# Patient Record
Sex: Female | Born: 1990 | Race: White | Hispanic: No | Marital: Single | Smoking: Never smoker
Health system: Southern US, Community
[De-identification: ages and names within clinical notes are randomized; demographics above are authoritative.]

## PROBLEM LIST (undated history)

## (undated) DIAGNOSIS — N159 Renal tubulo-interstitial disease, unspecified: Secondary | ICD-10-CM

---
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
## 2010-04-05 ENCOUNTER — Ambulatory Visit: Payer: Self-pay | Admitting: Family Medicine

## 2010-04-25 ENCOUNTER — Ambulatory Visit: Payer: Self-pay | Admitting: Family Medicine

## 2010-04-26 ENCOUNTER — Ambulatory Visit: Payer: Self-pay | Admitting: Family Medicine

## 2010-04-28 ENCOUNTER — Ambulatory Visit: Payer: Self-pay | Admitting: Family Medicine

## 2010-05-18 ENCOUNTER — Ambulatory Visit: Payer: Self-pay | Admitting: Family Medicine

## 2010-08-06 DIAGNOSIS — N159 Renal tubulo-interstitial disease, unspecified: Secondary | ICD-10-CM

## 2010-08-06 HISTORY — DX: Renal tubulo-interstitial disease, unspecified: N15.9

## 2010-09-14 ENCOUNTER — Ambulatory Visit: Payer: Self-pay | Admitting: Family Medicine

## 2011-11-14 IMAGING — CR RIGHT HIP - COMPLETE 2+ VIEW
1 series · 2 of 2 positions shown · non-contrast
Comparison: none

REASON FOR EXAM: HX SHALLEY JIM RT HIP PAIN
COMMENTS:

PROCEDURE:     DXR - DXR HIP RIGHT COMPLETE  - April 25, 2010  [DATE]
RESULT:     No fracture, dislocation or other acute bony abnormality is
identified. Hip joint space is well-maintained.

[Series 1: view not recorded · 0.17mm/px · 2 of 2 slices shown]
[im 1/2]
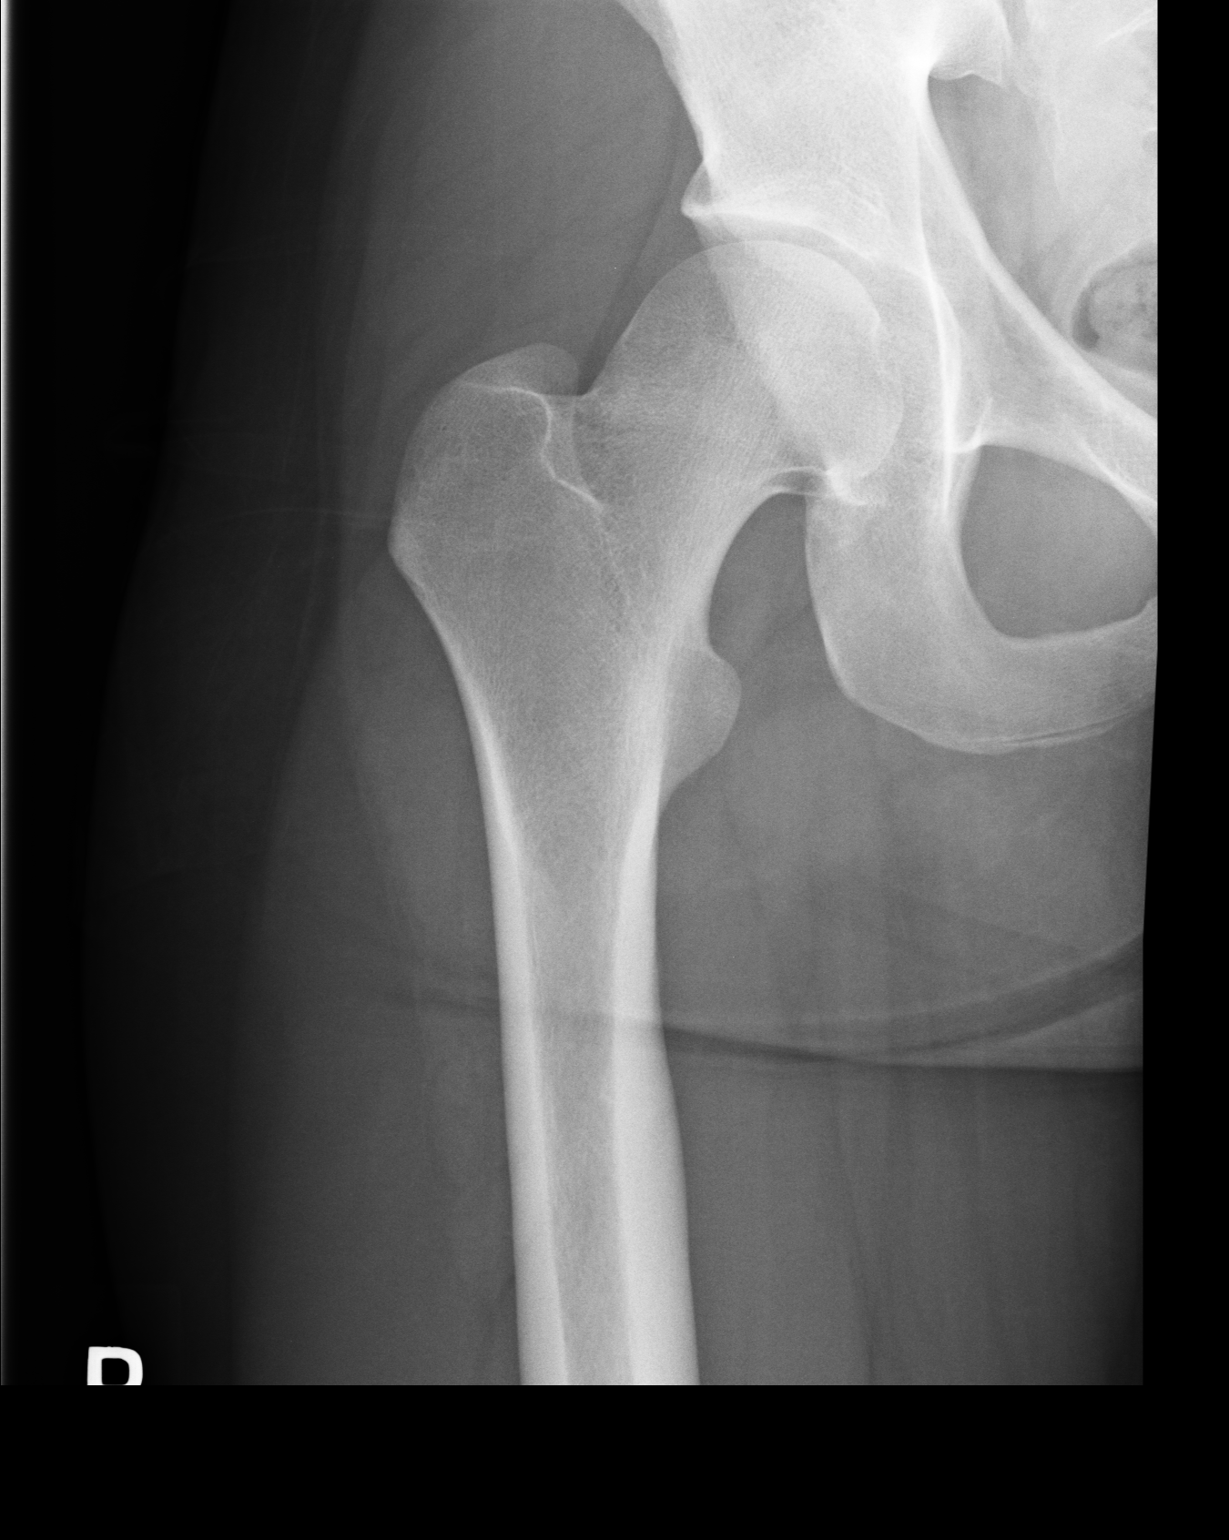
[im 2/2]
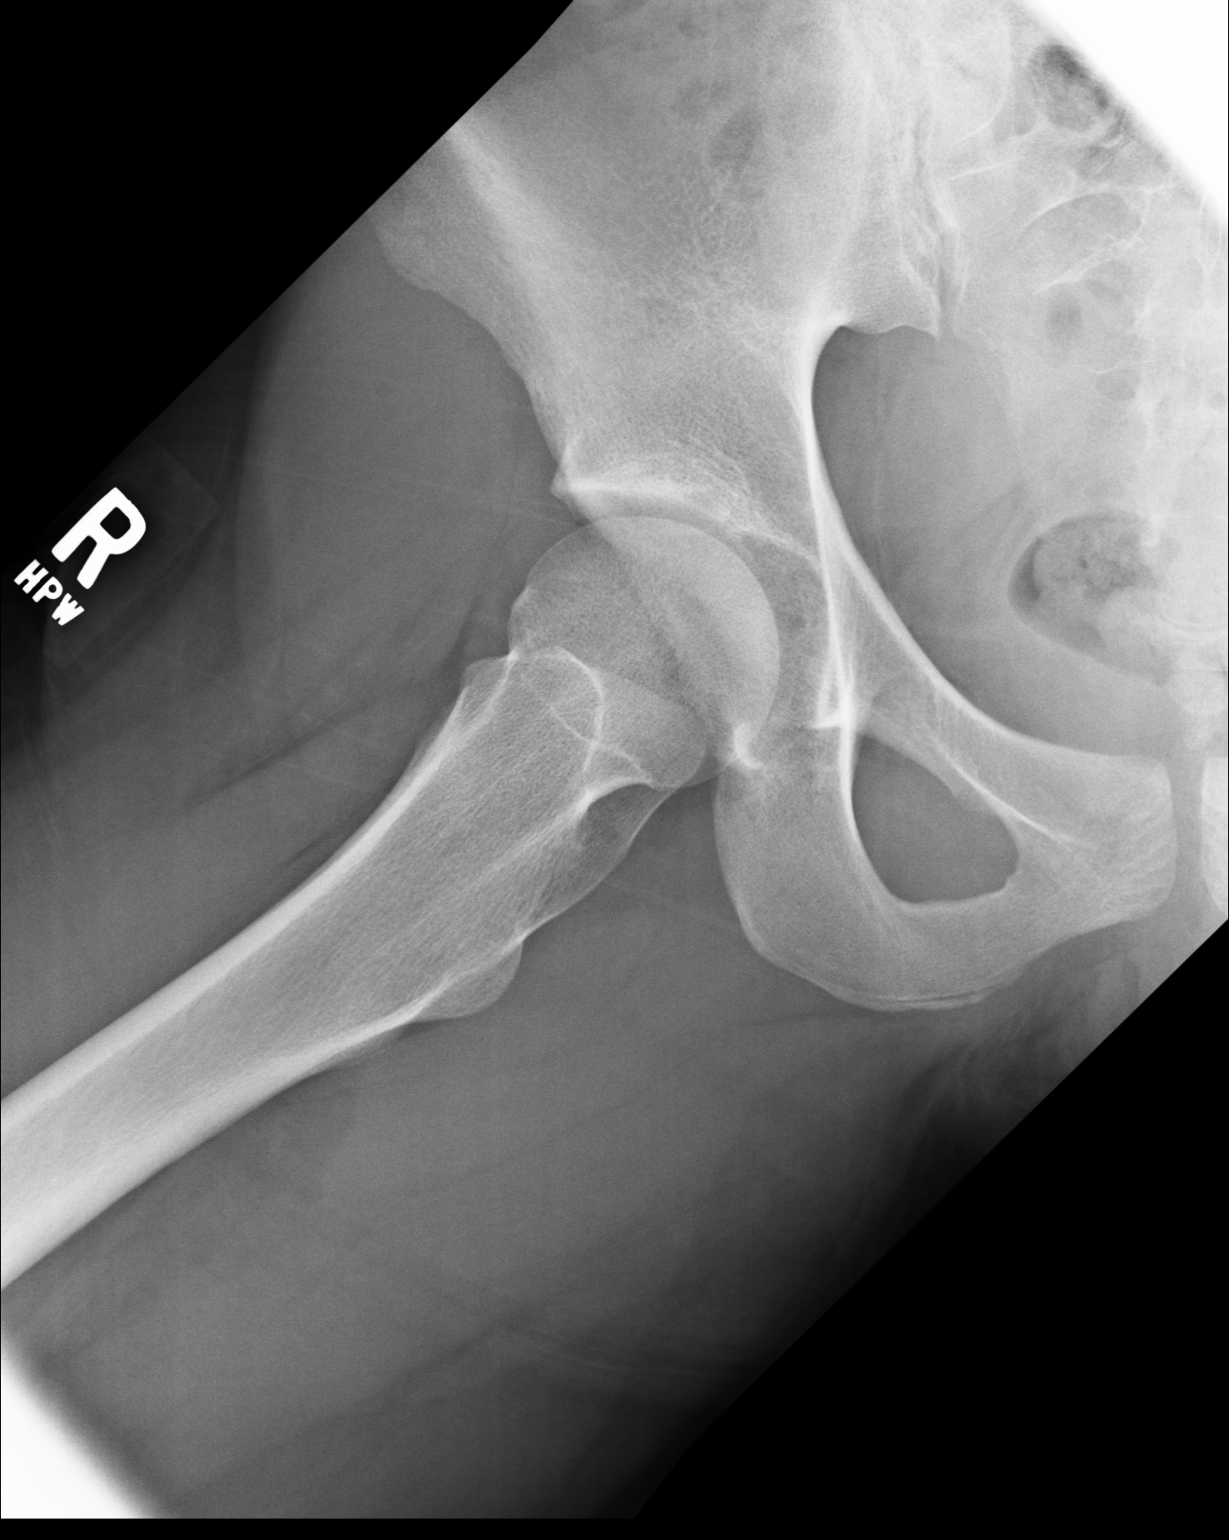

[2 of 2 positions shown; findings below may reference images not displayed]

IMPRESSION: No significant osseous abnormalities are noted.

## 2013-11-23 ENCOUNTER — Encounter (HOSPITAL_COMMUNITY): Payer: Self-pay | Admitting: Emergency Medicine

## 2013-11-23 ENCOUNTER — Emergency Department (INDEPENDENT_AMBULATORY_CARE_PROVIDER_SITE_OTHER)
Admission: EM | Admit: 2013-11-23 | Discharge: 2013-11-23 | Disposition: A | Discharge status: Home / Self Care | Payer: Managed Care, Other (non HMO) | Source: Home / Self Care

## 2013-11-23 DIAGNOSIS — S39012A Strain of muscle, fascia and tendon of lower back, initial encounter: Secondary | ICD-10-CM

## 2013-11-23 DIAGNOSIS — S335XXA Sprain of ligaments of lumbar spine, initial encounter: Secondary | ICD-10-CM

## 2013-11-23 HISTORY — DX: Renal tubulo-interstitial disease, unspecified: N15.9

## 2013-11-23 LAB — POCT URINALYSIS DIP (DEVICE)
Glucose, UA: NEGATIVE mg/dL
Hgb urine dipstick: NEGATIVE
Ketones, ur: NEGATIVE mg/dL
LEUKOCYTES UA: NEGATIVE
NITRITE: NEGATIVE
PH: 6 (ref 5.0–8.0)
PROTEIN: NEGATIVE mg/dL
Specific Gravity, Urine: 1.025 (ref 1.005–1.030)
Urobilinogen, UA: 0.2 mg/dL (ref 0.0–1.0)

## 2013-11-23 LAB — POCT PREGNANCY, URINE: Preg Test, Ur: NEGATIVE

## 2013-11-23 NOTE — Discharge Instructions (Signed)
Back Pain, Adult Low back pain is very common. About 1 in 5 people have back pain.The cause of low back pain is rarely dangerous. The pain often gets better over time.About half of people with a sudden onset of back pain feel better in just 2 weeks. About 8 in 10 people feel better by 6 weeks.  CAUSES Some common causes of back pain include:  Strain of the muscles or ligaments supporting the spine.  Wear and tear (degeneration) of the spinal discs.  Arthritis.  Direct injury to the back. DIAGNOSIS Most of the time, the direct cause of low back pain is not known.However, back pain can be treated effectively even when the exact cause of the pain is unknown.Answering your caregiver's questions about your overall health and symptoms is one of the most accurate ways to make sure the cause of your pain is not dangerous. If your caregiver needs more information, he or she may order lab work or imaging tests (X-rays or MRIs).However, even if imaging tests show changes in your back, this usually does not require surgery. HOME CARE INSTRUCTIONS For many people, back pain returns.Since low back pain is rarely dangerous, it is often a condition that people can learn to [facility] their own.   Remain active. It is stressful on the back to sit or stand in one place. Do not sit, drive, or stand in one place for more than 30 minutes at a time. Take short walks on level surfaces as soon as pain allows.Try to increase the length of time you walk each day.  Do not stay in bed.Resting more than 1 or 2 days can delay your recovery.  Do not avoid exercise or work.Your body is made to move.It is not dangerous to be active, even though your back may hurt.Your back will likely heal faster if you return to being active before your pain is gone.  Pay attention to your body when you bend and lift. Many people have less discomfortwhen lifting if they bend their knees, keep the load close to their bodies,and  avoid twisting. Often, the most comfortable positions are those that put less stress on your recovering back.  Find a comfortable position to sleep. Use a firm mattress and lie on your side with your knees slightly bent. If you lie on your back, put a pillow under your knees.  Only take over-the-counter or prescription medicines as directed by your caregiver. Over-the-counter medicines to reduce pain and inflammation are often the most helpful.Your caregiver may prescribe muscle relaxant drugs.These medicines help dull your pain so you can more quickly return to your normal activities and healthy exercise.  Put ice on the injured area.  Put ice in a plastic bag.  Place a towel between your skin and the bag.  Leave the ice on for 15-20 minutes, 03-04 times a day for the first 2 to 3 days. After that, ice and heat may be alternated to reduce pain and spasms.  Ask your caregiver about trying back exercises and gentle massage. This may be of some benefit.  Avoid feeling anxious or stressed.Stress increases muscle tension and can worsen back pain.It is important to recognize when you are anxious or stressed and learn ways to manage it.Exercise is a great option. SEEK MEDICAL CARE IF:  You have pain that is not relieved with rest or medicine.  You have pain that does not improve in 1 week.  You have new symptoms.  You are generally not feeling well. SEEK  IMMEDIATE MEDICAL CARE IF:   You have pain that radiates from your back into your legs.  You develop new bowel or bladder control problems.  You have unusual weakness or numbness in your arms or legs.  You develop nausea or vomiting.  You develop abdominal pain.  You feel faint. Document Released: 07/23/2005 Document Revised: 01/22/2012 Document Reviewed: 12/11/2010 [facility] Patient Information 2014 [place], [place].  Lumbosacral Strain Lumbosacral strain is a strain of any of the parts that make up your lumbosacral  vertebrae. Your lumbosacral vertebrae are the bones that make up the lower third of your backbone. Your lumbosacral vertebrae are held together by muscles and tough, fibrous tissue (ligaments).  CAUSES  A sudden blow to your back can cause lumbosacral strain. Also, anything that causes an excessive stretch of the muscles in the low back can cause this strain. This is typically seen when people exert themselves strenuously, fall, lift heavy objects, bend, or crouch repeatedly. RISK FACTORS  Physically demanding work.  Participation in pushing or pulling sports or sports that require sudden twist of the back (tennis, golf, baseball).  Weight lifting.  Excessive lower back curvature.  Forward-tilted pelvis.  Weak back or abdominal muscles or both.  Tight hamstrings. SIGNS AND SYMPTOMS  Lumbosacral strain may cause pain in the area of your injury or pain that moves (radiates) down your leg.  DIAGNOSIS Your health care provider can often diagnose lumbosacral strain through a physical exam. In some cases, you may need tests such as X-ray exams.  TREATMENT  Treatment for your lower back injury depends on many factors that your clinician will have to evaluate. However, most treatment will include the use of anti-inflammatory medicines. HOME CARE INSTRUCTIONS   Avoid hard physical activities (tennis, racquetball, waterskiing) if you are not in proper physical condition for it. This may aggravate or create problems.  If you have a back problem, avoid sports requiring sudden body movements. Swimming and walking are generally safer activities.  Maintain good posture.  Maintain a healthy weight.  Heat  When the low back starts healing, stretching and strengthening exercises may be recommended. SEEK MEDICAL CARE IF:  Your back pain is getting worse.  You experience severe back pain not relieved with medicines. SEEK IMMEDIATE MEDICAL CARE IF:   You have numbness, tingling, weakness, or  problems with the use of your arms or legs.  There is a change in bowel or bladder control.  You have increasing pain in any area of the body, including your belly (abdomen).  You notice shortness of breath, dizziness, or feel faint.  You feel sick to your stomach (nauseous), are throwing up (vomiting), or become sweaty.  You notice discoloration of your toes or legs, or your feet get very cold. MAKE SURE YOU:   Understand these instructions.  Will watch your condition.  Will get help right away if you are not doing well or get worse. Document Released: 05/02/2005 Document Revised: 05/13/2013 Document Reviewed: 03/11/2013 [facility] Patient Information 2014 [place], [place].

## 2013-11-23 NOTE — ED Notes (Signed)
C/o bil. Flank pain onset Friday and getting worse.  Took Advil 400 mg x 2 today.  No burning or frequency of urination.  She has been urinating more at night.   No hematuria.  Is concerned for possible kidney infection or muscle strain.

## 2013-11-23 NOTE — ED Provider Notes (Signed)
CSN: [id number]     Arrival date & time 11/23/13  1710 History   First MD Initiated Contact with Patient 11/23/13 1821     Chief Complaint  Patient presents with  . Back Pain   (Consider location/radiation/quality/duration/timing/severity/associated sxs/prior Treatment) HPI Comments: 23 y o f with low back pain for about 1 week. Located para lumbar musculature. Nonradiating. Worse with bending, lifting, getting out of bed in the AM. Better with hot shower.  No known injury, fall, trauma.  No dysuria, some nightime frequency.    Past Medical History  Diagnosis Date  . Kidney infection 2012   Past Surgical History  Procedure Laterality Date  . Wisdom tooth extraction  2009   History reviewed. No pertinent family history. History  Substance Use Topics  . Smoking status: Never Smoker   . Smokeless tobacco: Not on file  . Alcohol Use: Yes     Comment: occasional   OB History   Grav Para Term Preterm Abortions TAB SAB Ect Mult Living                 Review of Systems  Constitutional: Negative for fever, chills and activity change.  HENT: Negative.   Respiratory: Negative.   Cardiovascular: Negative.   Genitourinary: Negative for dysuria, frequency, flank pain, vaginal discharge and pelvic pain.  Musculoskeletal:       As per HPI  Skin: Negative for color change, pallor and rash.  Neurological: Negative.     Allergies  Review of patient's allergies indicates no known allergies.  Home Medications   Prior to Admission medications   Medication Sig Start Date End Date Taking? Authorizing Provider  ibuprofen (ADVIL,MOTRIN) 200 MG tablet Take 400 mg by mouth every 6 (six) hours as needed for moderate pain.   Yes Historical Provider, MD  Norethindrone-Ethinyl Estradiol-Fe (GENERESS FE) 0.8-25 MG-MCG tablet Chew 1 tablet by mouth daily.   Yes Historical Provider, MD   BP 122/79  Pulse 49  Temp(Src) 98 F (36.7 C) (Oral)  SpO2 99%  LMP 11/13/2013 Physical Exam  Nursing  note and vitals reviewed. Constitutional: She is oriented to person, place, and time. She appears well-developed and well-nourished. No distress.  Neck: Normal range of motion. Neck supple.  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   Pulmonary/Chest: Effort normal and breath sounds normal. No respiratory distress.  Abdominal: Soft. She exhibits no distension and no mass. There is no tenderness. There is no rebound and no guarding.  Musculoskeletal:  Tenderness paralumbar musculature. No spinal tenderness.   Neurological: She is alert and oriented to person, place, and time.  Skin: Skin is warm and dry.  Psychiatric: She has a normal mood and affect.    ED Course  Procedures (including critical care time) Labs Review Labs Reviewed  POCT PREGNANCY, URINE    Results for orders placed during the hospital encounter of 11/23/13  POCT PREGNANCY, URINE      Result Value Ref Range   Preg Test, Ur NEGATIVE  NEGATIVE   Imaging Review No results found.  Results for orders placed during the hospital encounter of 11/23/13  POCT PREGNANCY, URINE      Result Value Ref Range   Preg Test, Ur NEGATIVE  NEGATIVE    MDM   1. Strain of lumbar paraspinal muscle    Stretches, heat, NSAID of choice. No heavy lifting, pulling, or bad posture    Hayden Rasmussenavid Beverley Sherrard, NP 11/23/13 1845

## 2013-11-27 NOTE — ED Provider Notes (Signed)
Medical screening examination/treatment/procedure(s) were performed by resident physician or non-physician practitioner and as supervising physician I was immediately available for consultation/collaboration.   KINDL,JAMES DOUGLAS MD.   James D Kindl, MD 11/27/13 1030 

## 2015-03-23 ENCOUNTER — Encounter (HOSPITAL_COMMUNITY): Payer: Self-pay | Admitting: Emergency Medicine

## 2015-03-23 ENCOUNTER — Emergency Department (HOSPITAL_COMMUNITY)
Admission: EM | Admit: 2015-03-23 | Discharge: 2015-03-23 | Disposition: A | Discharge status: Home / Self Care | Payer: Managed Care, Other (non HMO) | Source: Home / Self Care | Attending: Family Medicine | Admitting: Family Medicine

## 2015-03-23 DIAGNOSIS — J020 Streptococcal pharyngitis: Secondary | ICD-10-CM | POA: Diagnosis not present

## 2015-03-23 LAB — POCT RAPID STREP A: Streptococcus, Group A Screen (Direct): NEGATIVE

## 2015-03-23 MED ORDER — AMOXICILLIN 500 MG PO CAPS: 500.0000 mg | ORAL_CAPSULE | Freq: Two times a day (BID) | ORAL | Status: AC

## 2015-03-23 MED ORDER — FLUCONAZOLE 150 MG PO TABS: 150.0000 mg | ORAL_TABLET | Freq: Every day | ORAL | Status: AC

## 2015-03-23 NOTE — Discharge Instructions (Signed)
You more than likely have a strep throat infection. Please use the antibiotic as prescribed. Please use your Advil 600-800 mg every 6-8 hours as needed for additional pain relief. Please also consider eating daily yogurt or a probiotic to help prevent diarrhea and upset stomach from the antibiotics. Please use the Diflucan if you develop a yeast infection.

## 2015-03-23 NOTE — ED Provider Notes (Signed)
CSN: [id number]     Arrival date & time 03/23/15  1650 History   First MD Initiated Contact with Patient 03/23/15 1709     Chief Complaint  Patient presents with  . URI   (Consider location/radiation/quality/duration/timing/severity/associated sxs/prior Treatment) HPI 1 day of sore throat. Denies any runny nose cough congestion, or other URI type symptoms. No sick contacts. Pain is constant and getting worse. Now with fevers greater than 101, diaphoresis, generalized body aches. Advil 400 mg with some improvement in pain.  Past Medical History  Diagnosis Date  . Kidney infection 2012   Past Surgical History  Procedure Laterality Date  . Wisdom tooth extraction  2009   History reviewed. No pertinent family history. Social History  Substance Use Topics  . Smoking status: Never Smoker   . Smokeless tobacco: None  . Alcohol Use: Yes     Comment: occasional   OB History    No data available     Review of Systems Per HPI with all other pertinent systems negative.   Allergies  Review of patient's allergies indicates no known allergies.  Home Medications   Prior to Admission medications   Medication Sig Start Date End Date Taking? Authorizing Provider  amoxicillin (AMOXIL) 500 MG capsule Take 1 capsule (500 mg total) by mouth 2 (two) times daily. 03/23/15   Ozella Rocks, MD  fluconazole (DIFLUCAN) 150 MG tablet Take 1 tablet (150 mg total) by mouth daily. Repeat dose in 3 days 03/23/15   Ozella Rocks, MD  ibuprofen (ADVIL,MOTRIN) 200 MG tablet Take 400 mg by mouth every 6 (six) hours as needed for moderate pain.    Historical Provider, MD  Norethindrone-Ethinyl Estradiol-Fe (GENERESS FE) 0.8-25 MG-MCG tablet Chew 1 tablet by mouth daily.    Historical Provider, MD   BP 118/73 mmHg  Pulse 77  Temp(Src) 100.1 F (37.8 C) (Oral)  Resp 16  SpO2 97%  LMP 03/09/2015 Physical Exam Physical Exam  Constitutional: oriented to person, place, and time. appears well-developed and  well-nourished. No distress.  HENT:  Head: Normocephalic and atraumatic.  Extremely injected pharynx, tonsils 0-1+ without exudate, "throaty" speech Eyes: EOMI. PERRL.  Neck: Normal range of motion.  Cardiovascular: RRR, no m/r/g, 2+ distal pulses,  Pulmonary/Chest: Effort normal and breath sounds normal. No respiratory distress.  Abdominal: Soft. Bowel sounds are normal. NonTTP, no distension.  Musculoskeletal: Normal range of motion. Non ttp, no effusion.  Neurological: alert and oriented to person, place, and time.  Skin: Skin is warm. No rash noted. non diaphoretic.  Psychiatric: normal mood and affect. behavior is normal. Judgment and thought content normal.    ED Course  Procedures (including critical care time) Labs Review Labs Reviewed  POCT RAPID STREP A    Imaging Review No results found.   MDM   1. Strep pharyngitis    Negative rapid strep, we'll send strep culture, amoxicillin, Diflucan if develops yeast infection as patient states that this happens with some regularity with antibiotic. Recommending probiotic and/or daily yogurt to prevent diarrhea and upset stomach on Avelox.    Ozella Rocks, MD 03/23/15 [phone]

## 2015-03-23 NOTE — ED Notes (Signed)
C/o cold sx States sore throat, fever, body ache and coughing  advil used as tx

## 2015-03-25 LAB — CULTURE, GROUP A STREP: Strep A Culture: NEGATIVE

## 2015-03-25 NOTE — ED Notes (Signed)
Patient called to report she has developed a generalized itchy red rash and has continued to take her amoxicillin. After review of her records which show a negative result on her final strep report, patient was advised to D/C her amoxicillin, to use OTC allergy medicine of choice, and to be rechecked if not better in a couple of days, or sooner if worse. Record has been amended to reflect allergy to amoxicillin

## 2023-03-14 ENCOUNTER — Ambulatory Visit (INDEPENDENT_AMBULATORY_CARE_PROVIDER_SITE_OTHER): Payer: Managed Care, Other (non HMO) | Admitting: Podiatry

## 2023-03-14 DIAGNOSIS — M778 Other enthesopathies, not elsewhere classified: Secondary | ICD-10-CM | POA: Diagnosis not present

## 2023-03-14 DIAGNOSIS — M84375A Stress fracture, left foot, initial encounter for fracture: Secondary | ICD-10-CM | POA: Diagnosis not present

## 2023-03-14 NOTE — Progress Notes (Signed)
Subjective:  Patient ID: Bridget Foley, female    DOB: February 19, 1991,  MRN: [id number]  Chief Complaint  Patient presents with   Foot Pain    Pain to left foot medial aspect of foot near the arch. Patient is a runner and the area has been hurting after she ran down and up some hills. She does not wear any inserts in her shoes.     32 y.o. female presents with concern for pain to the inside of her left foot along the medial arch.  Patient is a runner and recently noted some pain when she is running especially in a specific area on the inside of her left foot.  Has had multiple stress fractures in the past and wanted to get evaluated as she is concerned this may be another 1 based on how it feels.  She says the area specifically hurts worse after running up or down hills.  Denies any sudden increases in training volume recently but does admit to doing a lot of high-volume running in general.  Has seen another provider in the [place] area who said it could be a stress fracture took x-rays which the patient has and were reviewed at this visit.  That provider did not offer much in the way of treatment however.  Past Medical History:  Diagnosis Date   Kidney infection 2012    Allergies  Allergen Reactions   Amoxicillin    Oseltamivir Hives and Rash    ROS: Negative except as per HPI above  Objective:  General: AAO x3, NAD  Dermatological: With inspection and palpation of the right and left lower extremities there are no open sores, no preulcerative lesions, no rash or signs of infection present. Nails are of normal length thickness and coloration.   Vascular:  Dorsalis Pedis artery and Posterior Tibial artery pedal pulses are 2/4 bilateral.  Capillary fill time < 3 sec to all digits.   Neruologic: Grossly intact via light touch bilateral. Protective threshold intact to all sites bilateral.   Musculoskeletal: Focal pain on palpation along the medial arch along the medial aspect of the medial  cuneiform especially near his proximal margin and naviculocuneiform joint.  No significant pain on the navicular tuberosity or on the first metatarsal base  Gait: Unassisted, Nonantalgic.   No images are attached to the encounter.  Radiographs:  Reviewed outside x-rays from podiatrist in [place] 3 views XR AP lateral oblique of the left foot weightbearing.  Questionable osseous abnormality of radiolucency at the proximal aspect of the medial margin of the medial cuneiform near the navicular cuneiform joint consistent with possible stress fracture Assessment:   1. Stress fracture of left foot, initial encounter   2. Capsulitis of foot, left      Plan:  Patient was evaluated and treated and all questions answered.  # Stress fracture medial cuneiform left foot -Discussed with the patient I am concerned about possible stress fracture of the medial cuneiform.  This would coincide with her focal pain on palpation of the bone at this area.  There is little else in that location that would be causing focal pain and pain with impact when running. -X-rays are not optimal in regards to the view and exposure however do have some concern for osseous abnormality at the medial aspect of the medial cuneiform proximally near the [place] joint -Recommend MRI of the left foot without contrast to further evaluate for stress fracture of the medial cuneiform -Did recommend reducing activity level and holding off  on running until we have the MRI completed.  Would also recommend cam boot immobilization for pain control at this time -Also recommend vitamin D and calcium supplementation to aid in bone healing and bone strengthening          Corinna Gab, DPM [facility] / [facility]

## 2023-03-16 ENCOUNTER — Ambulatory Visit
Admission: RE | Admit: 2023-03-16 | Discharge: 2023-03-16 | Disposition: A | Discharge status: Home / Self Care | Payer: Managed Care, Other (non HMO) | Source: Ambulatory Visit | Attending: Podiatry | Admitting: Podiatry

## 2023-03-16 DIAGNOSIS — M84375A Stress fracture, left foot, initial encounter for fracture: Secondary | ICD-10-CM

## 2023-03-22 ENCOUNTER — Encounter: Payer: Self-pay | Admitting: Podiatry
